# Patient Record
Sex: Male | Born: 1980 | Race: Black or African American | Hispanic: No | Marital: Single | Smoking: Never smoker
Health system: Southern US, Community
[De-identification: ages and names within clinical notes are randomized; demographics above are authoritative.]

## PROBLEM LIST (undated history)

## (undated) DIAGNOSIS — I10 Essential (primary) hypertension: Secondary | ICD-10-CM

## (undated) HISTORY — PX: HERNIA REPAIR: SHX51

---

## 2006-12-09 ENCOUNTER — Emergency Department (HOSPITAL_COMMUNITY): Admission: EM | Admit: 2006-12-09 | Discharge: 2006-12-09 | Payer: Self-pay | Admitting: Emergency Medicine

## 2008-03-10 ENCOUNTER — Emergency Department (HOSPITAL_COMMUNITY): Admission: EM | Admit: 2008-03-10 | Discharge: 2008-03-10 | Payer: Self-pay | Admitting: Emergency Medicine

## 2009-08-11 IMAGING — CT CT ABDOMEN W/O CM
1 of 2 series · 15 of 32 positions shown, 19 images · non-contrast
Comparison: None available.

CT ABDOMEN

CLINICAL DATA: Left flank pain in patient history of urinary tract
stones.

CT ABDOMEN AND PELVIS WITHOUT CONTRAST
TECHNIQUE: Multidetector CT imaging of the abdomen and pelvis was
performed following the standard
protocol without intravenous contrast.

[Series 2: 160 stone 5.0 b40f st · axial · 0.68mm/px · z∈[-473,-113]mm · 15 of 83 slices shown, 19 images]
[im 7/83  soft-tissue]
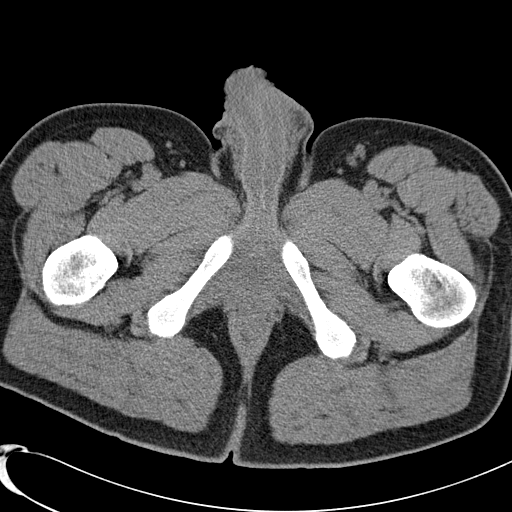
[im 7/83  bone]
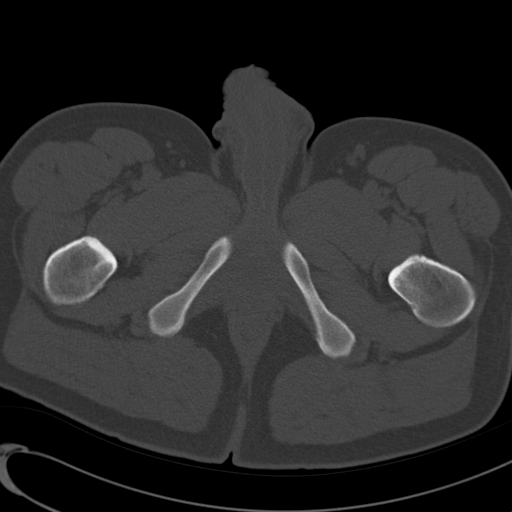
[im 13/83  soft-tissue]
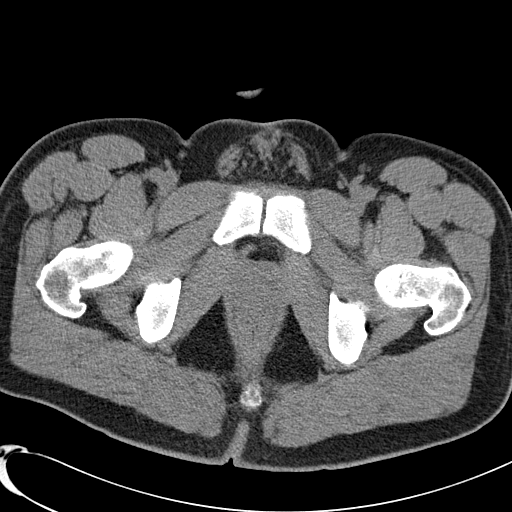
[im 19/83  soft-tissue]
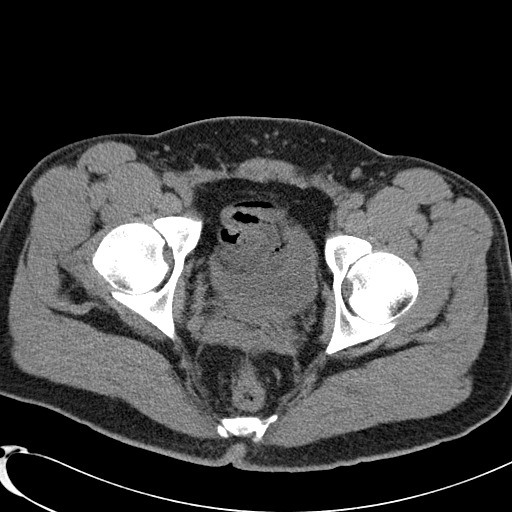
[im 25/83  soft-tissue]
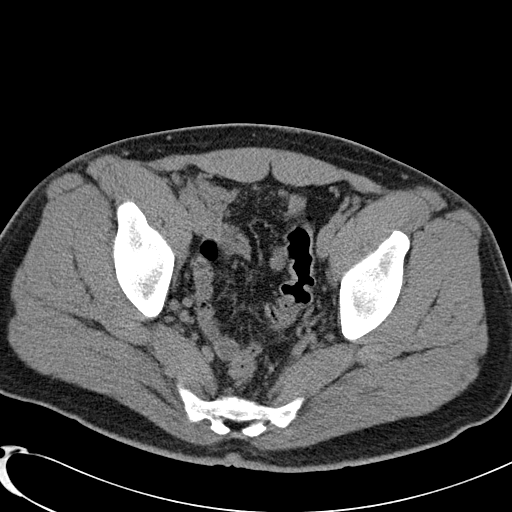
[im 31/83  soft-tissue]
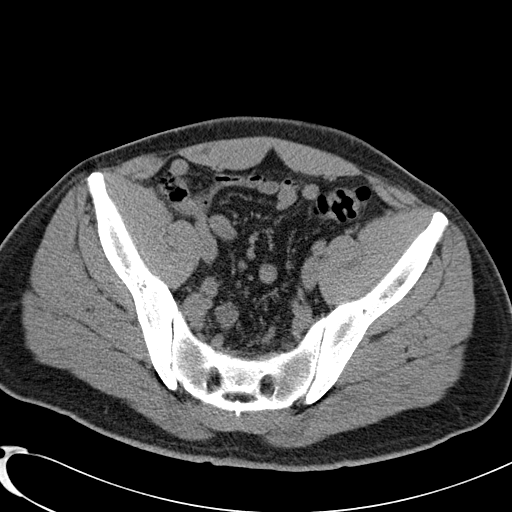
[im 37/83  soft-tissue]
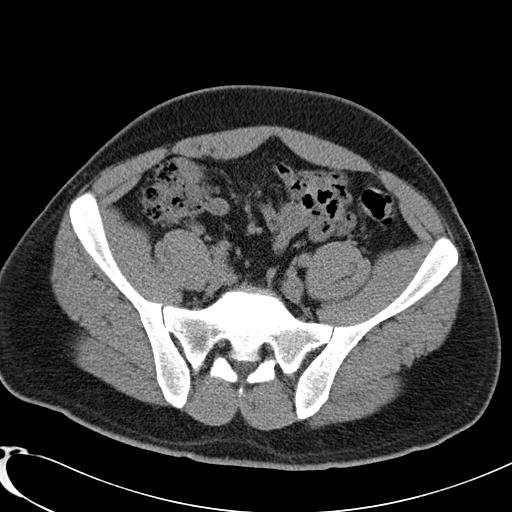
[im 43/83  soft-tissue]
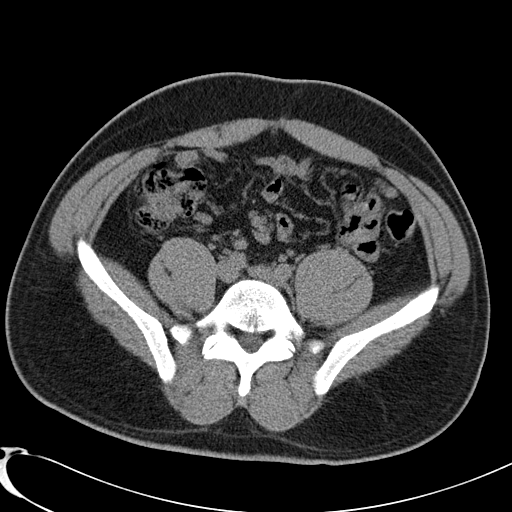
[im 49/83  soft-tissue]
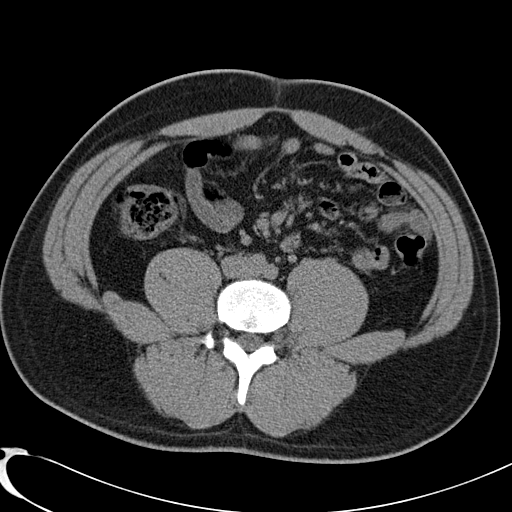
[im 55/83  soft-tissue]
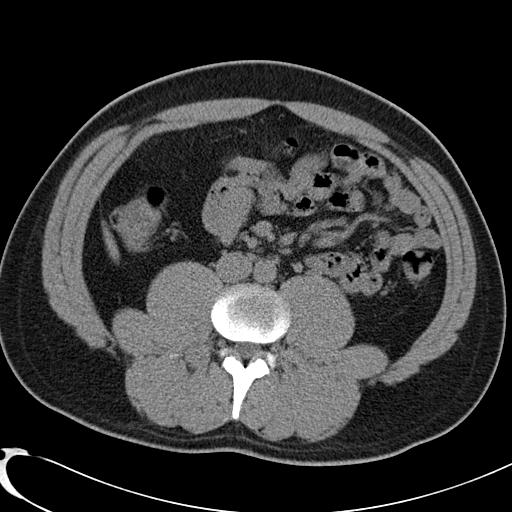
[im 55/83  bone]
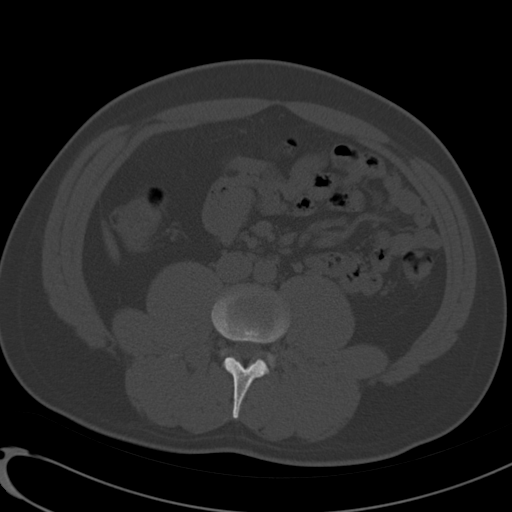
[im 61/83  soft-tissue]
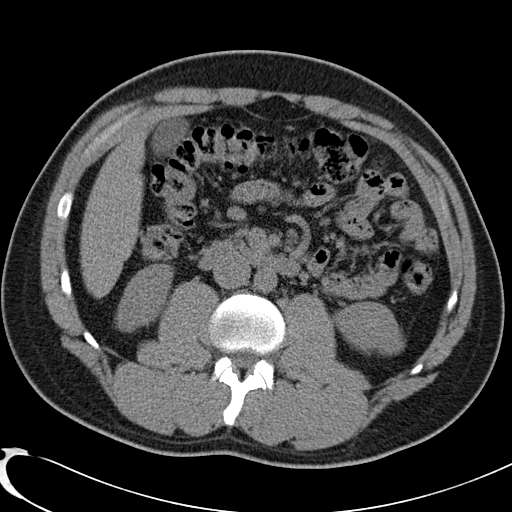
[im 67/83  soft-tissue]
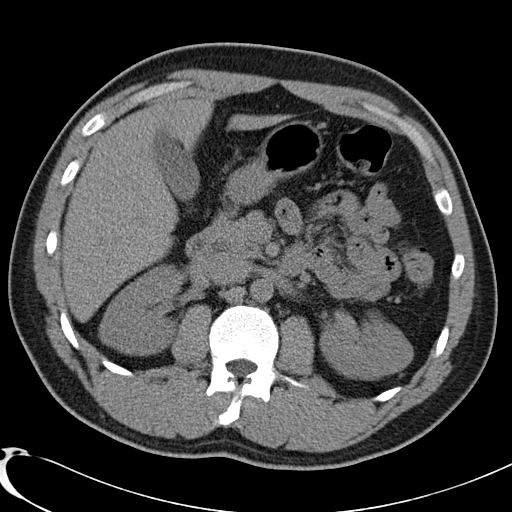
[im 70/83  lung]
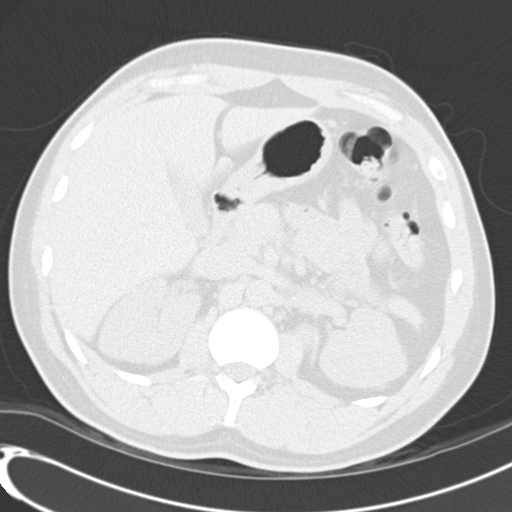
[im 73/83  soft-tissue]
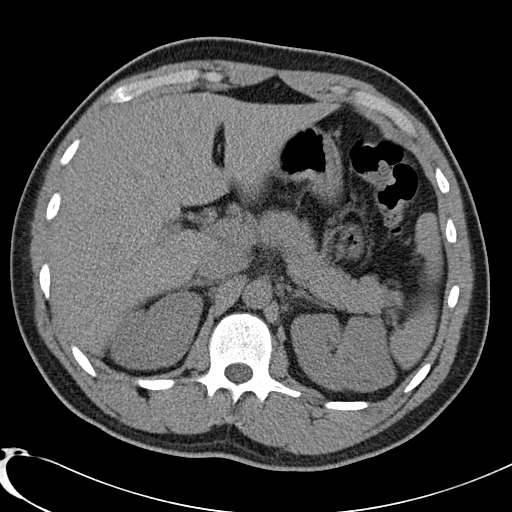
[im 73/83  lung]
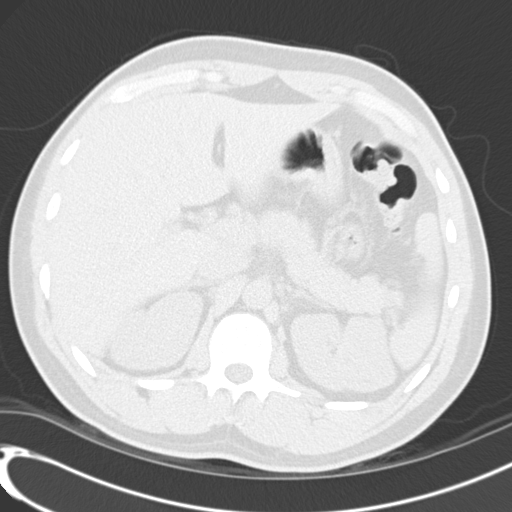
[im 76/83  lung]
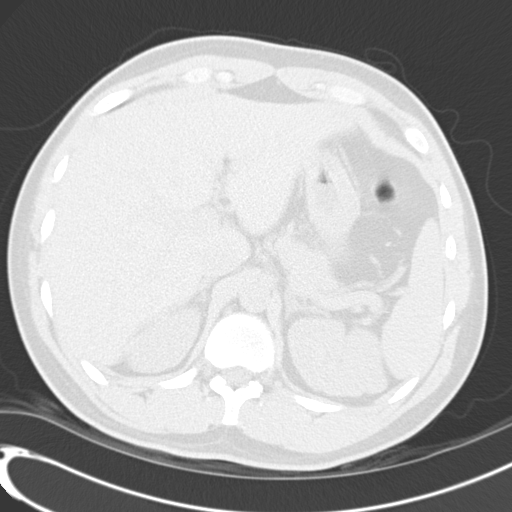
[im 79/83  soft-tissue]
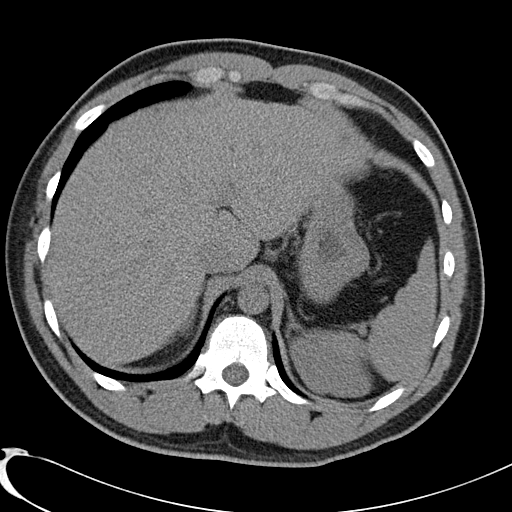
[im 79/83  lung]
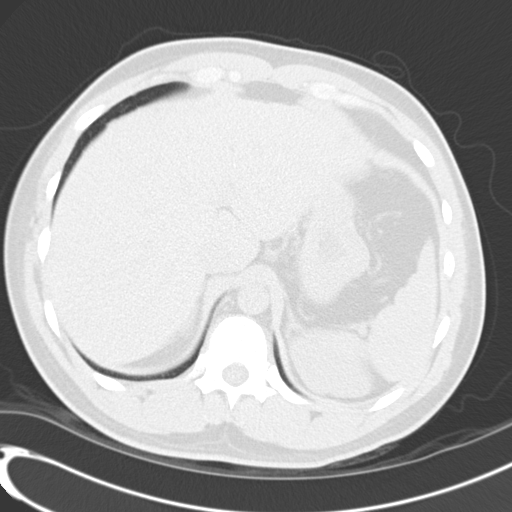

[15 of 32 positions shown; findings below may reference images not displayed]

FINDINGS: Minimal amount of lung parenchyma is imaged and is
clear.  There are no urinary tract stones and no hydronephrosis.
The uninfused appearance of the imaged portions of the liver,
gallbladder, biliary tree, spleen and pancreas is normal.  There is
no abdominal lymphadenopathy or fluid collection.  Stomach and
small bowel have a normal CT appearance.  No focal bony
abnormality.
IMPRESSION: Negative for urinary tract stones.  No acute finding.

CT PELVIS
FINDINGS: The urinary bladder, seminal vesicles and prostate gland
all appear normal.  Colon and appendix are normal in appearance.
There is no pelvic lymphadenopathy or fluid collection.  No focal
bony abnormality.
IMPRESSION: Negative pelvic CT.

## 2011-05-09 LAB — POCT I-STAT, CHEM 8
BUN: 10
Calcium, Ion: 0.97 — ABNORMAL LOW
Calcium, Ion: 1.15
Chloride: 103
Creatinine, Ser: 1.1
Creatinine, Ser: 1.2
Glucose, Bld: 89
HCT: 56 — ABNORMAL HIGH
Hemoglobin: 19 — ABNORMAL HIGH
TCO2: 29
TCO2: 29

## 2011-05-09 LAB — URINE CULTURE: Colony Count: 8000

## 2011-05-09 LAB — DIFFERENTIAL
Basophils Relative: 0
Eosinophils Absolute: 0.2
Eosinophils Relative: 2
Lymphs Abs: 2.1
Monocytes Absolute: 0.8
Monocytes Relative: 9
Neutrophils Relative %: 66

## 2011-05-09 LAB — CBC
HCT: 50.8
Hemoglobin: 17
MCHC: 33.5
MCV: 85.9
RBC: 5.91 — ABNORMAL HIGH

## 2011-05-09 LAB — URINALYSIS, ROUTINE W REFLEX MICROSCOPIC
Ketones, ur: NEGATIVE
Nitrite: NEGATIVE
Urobilinogen, UA: 1
pH: 7

## 2012-05-11 ENCOUNTER — Emergency Department (INDEPENDENT_AMBULATORY_CARE_PROVIDER_SITE_OTHER)
Admission: EM | Admit: 2012-05-11 | Discharge: 2012-05-11 | Disposition: A | Discharge status: Home / Self Care | Payer: Self-pay | Source: Home / Self Care | Attending: Emergency Medicine | Admitting: Emergency Medicine

## 2012-05-11 ENCOUNTER — Encounter (HOSPITAL_COMMUNITY): Payer: Self-pay | Admitting: *Deleted

## 2012-05-11 DIAGNOSIS — K0889 Other specified disorders of teeth and supporting structures: Secondary | ICD-10-CM

## 2012-05-11 DIAGNOSIS — K047 Periapical abscess without sinus: Secondary | ICD-10-CM

## 2012-05-11 DIAGNOSIS — K089 Disorder of teeth and supporting structures, unspecified: Secondary | ICD-10-CM

## 2012-05-11 MED ORDER — OXYCODONE-ACETAMINOPHEN 5-325 MG PO TABS: 2.0000 | ORAL_TABLET | ORAL | Status: AC | PRN

## 2012-05-11 MED ORDER — PENICILLIN V POTASSIUM 500 MG PO TABS: 500.0000 mg | ORAL_TABLET | Freq: Four times a day (QID) | ORAL | Status: AC

## 2012-05-11 NOTE — ED Provider Notes (Signed)
History     CSN: [id number]  Arrival date & time 05/11/12  1612   None     Chief Complaint  Patient presents with  . Dental Pain    (Consider location/radiation/quality/duration/timing/severity/associated sxs/prior treatment) Patient is a 31 y.o. male presenting with tooth pain. The history is provided by the patient.  Dental Pain Additional symptoms do not include: facial swelling and ear pain.   right upper tooth and right facial pain for since last night, no medication taken for pain, no known dental disease, last dentist visit last year.   +Thermal sensitivity No fever No gingival bleeding No decreased intake of food/fluid History reviewed. No pertinent past medical history.  History reviewed. No pertinent past surgical history.  Family History  Problem Relation Age of Onset  . Hypertension Other     History  Substance Use Topics  . Smoking status: Never Smoker   . Smokeless tobacco: Not on file  . Alcohol Use: Yes      Review of Systems  Constitutional: Negative.   HENT: Positive for neck pain and dental problem. Negative for ear pain, facial swelling, neck stiffness, tinnitus and ear discharge.   Respiratory: Negative.   Cardiovascular: Negative.     Allergies  Review of patient's allergies indicates not on file.  Home Medications   Current Outpatient Rx  Name Route Sig Dispense Refill  . OXYCODONE-ACETAMINOPHEN 5-325 MG PO TABS Oral Take 2 tablets by mouth every 4 (four) hours as needed for pain. 12 tablet 0  . PENICILLIN V POTASSIUM 500 MG PO TABS Oral Take 1 tablet (500 mg total) by mouth 4 (four) times daily. 20 tablet 0    BP 165/112  Pulse 74  Temp 98.2 F (36.8 C) (Oral)  Resp 17  SpO2 100%  Physical Exam  Nursing note and vitals reviewed. Constitutional: He is oriented to person, place, and time. Vital signs are normal. He appears well-developed and well-nourished. He is active and cooperative.  HENT:  Head: Normocephalic. There is  trismus in the jaw.  Right Ear: Hearing, tympanic membrane, external ear and ear canal normal.  Left Ear: Hearing, tympanic membrane, external ear and ear canal normal.  Nose: Nose normal.  Mouth/Throat: Uvula is midline, oropharynx is clear and moist and mucous membranes are normal. Normal dentition. Dental abscesses present. No uvula swelling or dental caries.  Eyes: Conjunctivae normal are normal. Pupils are equal, round, and reactive to light. No scleral icterus.  Neck: Trachea normal, normal range of motion and full passive range of motion without pain. Neck supple. No spinous process tenderness and no muscular tenderness present.  Cardiovascular: Normal rate and regular rhythm.   Pulmonary/Chest: Effort normal and breath sounds normal.  Lymphadenopathy:       Head (right side): No submental, no submandibular, no tonsillar, no preauricular, no posterior auricular and no occipital adenopathy present.       Head (left side): No submental, no submandibular, no tonsillar, no preauricular, no posterior auricular and no occipital adenopathy present.    He has no cervical adenopathy.  Neurological: He is alert and oriented to person, place, and time. No cranial nerve deficit or sensory deficit.  Skin: Skin is warm and dry.  Psychiatric: He has a normal mood and affect. His speech is normal and behavior is normal. Judgment and thought content normal. Cognition and memory are normal.    ED Course  Procedures (including critical care time)  Labs Reviewed - No data to display No results found.  1. Pain, dental   2. Dental abscess       MDM  Medications as prescribed.  Follow up with dentist ASAP for further evaluation.  Resources provided.       Johnsie Kindred, NP 05/11/12 1859

## 2012-05-11 NOTE — ED Notes (Signed)
Pt  Reports  r  Upper  Toothache         Since  Last  Night  Pt  Has  Attempted  otc  meds      Aleve  /  orajel  Without  releif  Pt  States    Has  Family  History  Of   Hypertension

## 2014-10-17 ENCOUNTER — Emergency Department (HOSPITAL_COMMUNITY)
Admission: EM | Admit: 2014-10-17 | Discharge: 2014-10-17 | Disposition: A | Discharge status: Home / Self Care | Payer: 59 | Attending: Emergency Medicine | Admitting: Emergency Medicine

## 2014-10-17 ENCOUNTER — Encounter (HOSPITAL_COMMUNITY): Payer: Self-pay | Admitting: Emergency Medicine

## 2014-10-17 DIAGNOSIS — R6883 Chills (without fever): Secondary | ICD-10-CM | POA: Insufficient documentation

## 2014-10-17 DIAGNOSIS — R110 Nausea: Secondary | ICD-10-CM | POA: Diagnosis present

## 2014-10-17 DIAGNOSIS — R531 Weakness: Secondary | ICD-10-CM | POA: Insufficient documentation

## 2014-10-17 DIAGNOSIS — M791 Myalgia: Secondary | ICD-10-CM | POA: Diagnosis not present

## 2014-10-17 DIAGNOSIS — R197 Diarrhea, unspecified: Secondary | ICD-10-CM | POA: Insufficient documentation

## 2014-10-17 DIAGNOSIS — R112 Nausea with vomiting, unspecified: Secondary | ICD-10-CM | POA: Diagnosis not present

## 2014-10-17 DIAGNOSIS — R109 Unspecified abdominal pain: Secondary | ICD-10-CM | POA: Insufficient documentation

## 2014-10-17 DIAGNOSIS — R5383 Other fatigue: Secondary | ICD-10-CM | POA: Diagnosis not present

## 2014-10-17 DIAGNOSIS — I10 Essential (primary) hypertension: Secondary | ICD-10-CM | POA: Diagnosis not present

## 2014-10-17 HISTORY — DX: Essential (primary) hypertension: I10

## 2014-10-17 LAB — CBC WITH DIFFERENTIAL/PLATELET
BASOS ABS: 0 10*3/uL (ref 0.0–0.1)
BASOS ABS: 0 10*3/uL (ref 0.0–0.1)
BASOS PCT: 0 % (ref 0–1)
Basophils Relative: 0 % (ref 0–1)
EOS ABS: 0.1 10*3/uL (ref 0.0–0.7)
EOS PCT: 3 % (ref 0–5)
EOS PCT: 2 % (ref 0–5)
Eosinophils Absolute: 0.1 10*3/uL (ref 0.0–0.7)
HCT: 49.3 % (ref 39.0–52.0)
HEMATOCRIT: 50.7 % (ref 39.0–52.0)
HEMOGLOBIN: 16.9 g/dL (ref 13.0–17.0)
Hemoglobin: 16.9 g/dL (ref 13.0–17.0)
LYMPHS ABS: 1.6 10*3/uL (ref 0.7–4.0)
LYMPHS PCT: 28 % (ref 12–46)
LYMPHS PCT: 29 % (ref 12–46)
Lymphs Abs: 1.5 10*3/uL (ref 0.7–4.0)
MCH: 28.6 pg (ref 26.0–34.0)
MCH: 28.8 pg (ref 26.0–34.0)
MCHC: 33.3 g/dL (ref 30.0–36.0)
MCHC: 34.3 g/dL (ref 30.0–36.0)
MCV: 85.8 fL (ref 78.0–100.0)
MCV: 84.1 fL (ref 78.0–100.0)
MONO ABS: 1.1 10*3/uL — AB (ref 0.1–1.0)
MONO ABS: 1.1 10*3/uL — AB (ref 0.1–1.0)
MONOS PCT: 21 % — AB (ref 3–12)
Monocytes Relative: 21 % — ABNORMAL HIGH (ref 3–12)
NEUTROS ABS: 2.6 10*3/uL (ref 1.7–7.7)
NEUTROS ABS: 2.6 10*3/uL (ref 1.7–7.7)
Neutrophils Relative %: 49 % (ref 43–77)
Neutrophils Relative %: 48 % (ref 43–77)
PLATELETS: 235 10*3/uL (ref 150–400)
Platelets: 212 10*3/uL (ref 150–400)
RBC: 5.91 MIL/uL — AB (ref 4.22–5.81)
RBC: 5.86 MIL/uL — AB (ref 4.22–5.81)
RDW: 14.6 % (ref 11.5–15.5)
RDW: 14.4 % (ref 11.5–15.5)
WBC: 5.3 10*3/uL (ref 4.0–10.5)
WBC: 5.3 10*3/uL (ref 4.0–10.5)

## 2014-10-17 LAB — URINALYSIS, ROUTINE W REFLEX MICROSCOPIC
BILIRUBIN URINE: NEGATIVE
Glucose, UA: NEGATIVE mg/dL
HGB URINE DIPSTICK: NEGATIVE
Ketones, ur: NEGATIVE mg/dL
Leukocytes, UA: NEGATIVE
NITRITE: NEGATIVE
PROTEIN: 30 mg/dL — AB
SPECIFIC GRAVITY, URINE: 1.038 — AB (ref 1.005–1.030)
UROBILINOGEN UA: 0.2 mg/dL (ref 0.0–1.0)
pH: 6 (ref 5.0–8.0)

## 2014-10-17 LAB — URINE MICROSCOPIC-ADD ON

## 2014-10-17 LAB — COMPREHENSIVE METABOLIC PANEL
ALT: 31 U/L (ref 0–53)
AST: 32 U/L (ref 0–37)
Albumin: 3.5 g/dL (ref 3.5–5.2)
Alkaline Phosphatase: 58 U/L (ref 39–117)
Anion gap: 6 (ref 5–15)
BILIRUBIN TOTAL: 1.2 mg/dL (ref 0.3–1.2)
BUN: 17 mg/dL (ref 6–23)
CALCIUM: 8.2 mg/dL — AB (ref 8.4–10.5)
CHLORIDE: 105 mmol/L (ref 96–112)
CO2: 28 mmol/L (ref 19–32)
CREATININE: 1.06 mg/dL (ref 0.50–1.35)
GLUCOSE: 90 mg/dL (ref 70–99)
Potassium: 4.4 mmol/L (ref 3.5–5.1)
SODIUM: 139 mmol/L (ref 135–145)
Total Protein: 6.5 g/dL (ref 6.0–8.3)

## 2014-10-17 LAB — CLOSTRIDIUM DIFFICILE BY PCR: Toxigenic C. Difficile by PCR: NEGATIVE

## 2014-10-17 LAB — LIPASE, BLOOD: Lipase: 22 U/L (ref 11–59)

## 2014-10-17 MED ORDER — DIPHENOXYLATE-ATROPINE 2.5-0.025 MG PO TABS
2.0000 | ORAL_TABLET | Freq: Once | ORAL | Status: AC
  Administered 2014-10-17: 2 via ORAL
  Filled 2014-10-17: qty 2

## 2014-10-17 MED ORDER — DIPHENOXYLATE-ATROPINE 2.5-0.025 MG PO TABS
1.0000 | ORAL_TABLET | Freq: Four times a day (QID) | ORAL | Status: AC | PRN
Start: 2014-10-17 — End: ?

## 2014-10-17 MED ORDER — KETOROLAC TROMETHAMINE 30 MG/ML IJ SOLN
30.0000 mg | Freq: Once | INTRAMUSCULAR | Status: AC
Start: 2014-10-17 — End: 2014-10-17
  Administered 2014-10-17: 30 mg via INTRAVENOUS
  Filled 2014-10-17: qty 1

## 2014-10-17 MED ORDER — SODIUM CHLORIDE 0.9 % IV BOLUS (SEPSIS)
1000.0000 mL | Freq: Once | INTRAVENOUS | Status: AC
  Administered 2014-10-17: 1000 mL via INTRAVENOUS

## 2014-10-17 NOTE — Discharge Instructions (Signed)
Drink plenty of fluids. Rest. Lomotil for diarrhea. Mild foods. Follow up with your doctor if symptoms continue. Return if worsening.   Viral Gastroenteritis Viral gastroenteritis is also known as stomach flu. This condition affects the stomach and intestinal tract. It can cause sudden diarrhea and vomiting. The illness typically lasts 3 to 8 days. Most people develop an immune response that eventually gets rid of the virus. While this natural response develops, the virus can make you quite ill. CAUSES  Many different viruses can cause gastroenteritis, such as rotavirus or noroviruses. You can catch one of these viruses by consuming contaminated food or water. You may also catch a virus by sharing utensils or other personal items with an infected person or by touching a contaminated surface. SYMPTOMS  The most common symptoms are diarrhea and vomiting. These problems can cause a severe loss of body fluids (dehydration) and a body salt (electrolyte) imbalance. Other symptoms may include:  Fever.  Headache.  Fatigue.  Abdominal pain. DIAGNOSIS  Your caregiver can usually diagnose viral gastroenteritis based on your symptoms and a physical exam. A stool sample may also be taken to test for the presence of viruses or other infections. TREATMENT  This illness typically goes away on its own. Treatments are aimed at rehydration. The most serious cases of viral gastroenteritis involve vomiting so severely that you are not able to keep fluids down. In these cases, fluids must be given through an intravenous line (IV). HOME CARE INSTRUCTIONS   Drink enough fluids to keep your urine clear or pale yellow. Drink small amounts of fluids frequently and increase the amounts as tolerated.  Ask your caregiver for specific rehydration instructions.  Avoid:  Foods high in sugar.  Alcohol.  Carbonated drinks.  Tobacco.  Juice.  Caffeine drinks.  Extremely hot or cold fluids.  Fatty, greasy  foods.  Too much intake of anything at one time.  Dairy products until 24 to 48 hours after diarrhea stops.  You may consume probiotics. Probiotics are active cultures of beneficial bacteria. They may lessen the amount and number of diarrheal stools in adults. Probiotics can be found in yogurt with active cultures and in supplements.  Wash your hands well to avoid spreading the virus.  Only take over-the-counter or prescription medicines for pain, discomfort, or fever as directed by your caregiver. Do not give aspirin to children. Antidiarrheal medicines are not recommended.  Ask your caregiver if you should continue to take your regular prescribed and over-the-counter medicines.  Keep all follow-up appointments as directed by your caregiver. SEEK IMMEDIATE MEDICAL CARE IF:   You are unable to keep fluids down.  You do not urinate at least once every 6 to 8 hours.  You develop shortness of breath.  You notice blood in your stool or vomit. This may look like coffee grounds.  You have abdominal pain that increases or is concentrated in one small area (localized).  You have persistent vomiting or diarrhea.  You have a fever.  The patient is a child younger than 3 months, and he or she has a fever.  The patient is a child older than 3 months, and he or she has a fever and persistent symptoms.  The patient is a child older than 3 months, and he or she has a fever and symptoms suddenly get worse.  The patient is a baby, and he or she has no tears when crying. MAKE SURE YOU:   Understand these instructions.  Will watch your condition.  Will get help right away if you are not doing well or get worse. Document Released: 07/28/2005 Document Revised: 10/20/2011 Document Reviewed: 05/14/2011 [facility] Patient Information 2015 [place], [place]. This information is not intended to replace advice given to you by your health care provider. Make sure you discuss any questions you have with  your health care provider.

## 2014-10-17 NOTE — ED Provider Notes (Signed)
CSN: [id number]     Arrival date & time 10/17/14  [id number] History   First MD Initiated Contact with Patient 10/17/14 0919     Chief Complaint  Patient presents with  . Chills  . Nausea     (Consider location/radiation/quality/duration/timing/severity/associated sxs/prior Treatment) HPI Ronald Sharp is a 34 y.o. male with history of hypertension, presents to emergency department complaining of headache, body aches, abdominal pain, nausea, vomiting, diarrhea. Patient states that his symptoms started yesterday in the middle of the night. States started with nausea, vomiting and diarrhea. Since then vomiting has subsided and he is able to tolerate fluids orally. States he continues to have diarrhea every half an hour. States stools are liquid, foul-smelling. He states he is now having headache, body aches, feeling weak. He has been drinking Gatorade. He took Advil yesterday for his headache and took Pepto-Bismol for his stomach issues yesterday and again today. He denies any blood in his stool or in his emesis. He denies any fever. He states he recently went to visit his friends family who were all sick with gastroenteritis. He denies any photophobia, neck pain or stiffness, back pain, urinary symptoms. He denies any recent travel. He denies taking any recent antibiotics. No other complaints.  Past Medical History  Diagnosis Date  . Hypertension    Past Surgical History  Procedure Laterality Date  . Hernia repair     Family History  Problem Relation Age of Onset  . Hypertension Other    History  Substance Use Topics  . Smoking status: Never Smoker   . Smokeless tobacco: Not on file  . Alcohol Use: Yes    Review of Systems  Constitutional: Positive for fatigue. Negative for fever and chills.  Respiratory: Negative for cough, chest tightness and shortness of breath.   Cardiovascular: Negative for chest pain, palpitations and leg swelling.  Gastrointestinal: Positive for nausea, vomiting,  abdominal pain and diarrhea. Negative for abdominal distention.  Genitourinary: Negative for dysuria, urgency, frequency and hematuria.  Musculoskeletal: Positive for myalgias. Negative for neck pain and neck stiffness.  Skin: Negative for rash.  Allergic/Immunologic: Negative for immunocompromised state.  Neurological: Positive for weakness. Negative for dizziness, light-headedness, numbness and headaches.  All other systems reviewed and are negative.     Allergies  Review of patient's allergies indicates no known allergies.  Home Medications   Prior to Admission medications   Medication Sig Start Date End Date Taking? Authorizing Provider  oxyCODONE-acetaminophen (PERCOCET/ROXICET) 5-325 MG per tablet Take 2 tablets by mouth every 4 (four) hours as needed for pain. 05/11/12   Carmen L Chatten, NP   BP 142/72 mmHg  Pulse 71  Temp(Src) 98.7 F (37.1 C) (Oral)  Resp 18  Ht 6\' 3"  (1.905 m)  Wt 233 lb (105.688 kg)  BMI 29.12 kg/m2  SpO2 98% Physical Exam  Constitutional: He is oriented to person, place, and time. He appears well-developed and well-nourished. No distress.  HENT:  Head: Normocephalic and atraumatic.  Mouth/Throat: Oropharynx is clear and moist.  Eyes: Conjunctivae are normal.  Neck: Neck supple.  Cardiovascular: Normal rate, regular rhythm and normal heart sounds.   Pulmonary/Chest: Effort normal. No respiratory distress. He has no wheezes. He has no rales.  Abdominal: Soft. Bowel sounds are normal. He exhibits no distension. There is no tenderness. There is no rebound and no guarding.  Musculoskeletal: He exhibits no edema.  Neurological: He is alert and oriented to person, place, and time.  Skin: Skin is warm and dry.  Nursing  note and vitals reviewed.   ED Course  Procedures (including critical care time) Labs Review Labs Reviewed  CBC WITH DIFFERENTIAL/PLATELET - Abnormal; Notable for the following:    RBC 5.86 (*)    Monocytes Relative 21 (*)     Monocytes Absolute 1.1 (*)    All other components within normal limits  CBC WITH DIFFERENTIAL/PLATELET - Abnormal; Notable for the following:    RBC 5.91 (*)    Monocytes Relative 21 (*)    Monocytes Absolute 1.1 (*)    All other components within normal limits  URINALYSIS, ROUTINE W REFLEX MICROSCOPIC - Abnormal; Notable for the following:    Color, Urine AMBER (*)    Specific Gravity, Urine 1.038 (*)    Protein, ur 30 (*)    All other components within normal limits  URINE MICROSCOPIC-ADD ON - Abnormal; Notable for the following:    Bacteria, UA FEW (*)    All other components within normal limits  COMPREHENSIVE METABOLIC PANEL - Abnormal; Notable for the following:    Calcium 8.2 (*)    All other components within normal limits  CLOSTRIDIUM DIFFICILE BY PCR  LIPASE, BLOOD  CBC WITH DIFFERENTIAL/PLATELET  COMPREHENSIVE METABOLIC PANEL  LIPASE, BLOOD    Imaging Review No results found.   EKG Interpretation None      MDM   Final diagnoses:  Nausea vomiting and diarrhea    Patient with nausea, vomiting, diarrhea, abdominal pain however abdomen is benign on exam. Symptoms for just over 24 hours. He is afebrile, normal vital signs. He is otherwise healthy. We'll get labs, urinalysis, IV fluids with IV medications and antiemetics ordered.  There is a delay lab work due to lab system going down. First set of labs hemolyzed, had to redraw. Patient is feeling better after IV fluids, no diarrhea and emergency department other than when we asked him for stool sample to check for C. difficile. C. difficile is negative.  Labs all unremarkable. Patient is tolerating by mouth fluids in the emergency department. He is stable for discharge home at this time. Most likely gastroenteritis given recent ill contacts. His abdomen is benign, non tender. Instructed to follow up with pcp. Home with lomotil.   Filed Vitals:   10/17/14 1235 10/17/14 1315 10/17/14 1400 10/17/14 1443  BP: 133/81  124/77 128/67 142/77  Pulse: 68 67 67 68  Temp: 98.1 F (36.7 C)   98.1 F (36.7 C)  TempSrc: Oral   Oral  Resp: 20   16  Height:      Weight:      SpO2: 98% 98% 99% 98%     Jaynie Crumble, PA-C 10/17/14 1625  Doug Sou, MD 10/17/14 1714

## 2014-10-17 NOTE — ED Notes (Signed)
Patient states he has had vomiting since Monday at 1400 patient states he has has at least 20 "runny stools"  States the stools smell different.
